# Patient Record
Sex: Female | Born: 1995 | Race: Black or African American | Hispanic: No | Marital: Single | Smoking: Never smoker
Health system: Southern US, Community
[De-identification: ages and names within clinical notes are randomized; demographics above are authoritative.]

## PROBLEM LIST (undated history)

## (undated) DIAGNOSIS — Z789 Other specified health status: Secondary | ICD-10-CM

---

## 2014-01-04 ENCOUNTER — Emergency Department (HOSPITAL_COMMUNITY): Payer: 59

## 2014-01-04 ENCOUNTER — Inpatient Hospital Stay (HOSPITAL_COMMUNITY)
Admission: EM | Admit: 2014-01-04 | Discharge: 2014-01-13 | DRG: 958 | Disposition: A | Discharge status: Home Health | Payer: 59 | Attending: General Surgery | Admitting: General Surgery

## 2014-01-04 ENCOUNTER — Inpatient Hospital Stay (HOSPITAL_COMMUNITY): Payer: 59

## 2014-01-04 ENCOUNTER — Encounter (HOSPITAL_COMMUNITY): Payer: Self-pay

## 2014-01-04 DIAGNOSIS — S322XXA Fracture of coccyx, initial encounter for closed fracture: Secondary | ICD-10-CM

## 2014-01-04 DIAGNOSIS — S329XXA Fracture of unspecified parts of lumbosacral spine and pelvis, initial encounter for closed fracture: Secondary | ICD-10-CM

## 2014-01-04 DIAGNOSIS — S32592A Other specified fracture of left pubis, initial encounter for closed fracture: Secondary | ICD-10-CM

## 2014-01-04 DIAGNOSIS — S060XAA Concussion with loss of consciousness status unknown, initial encounter: Secondary | ICD-10-CM | POA: Diagnosis present

## 2014-01-04 DIAGNOSIS — S36113A Laceration of liver, unspecified degree, initial encounter: Secondary | ICD-10-CM

## 2014-01-04 DIAGNOSIS — S3210XA Unspecified fracture of sacrum, initial encounter for closed fracture: Secondary | ICD-10-CM | POA: Diagnosis present

## 2014-01-04 DIAGNOSIS — S32509A Unspecified fracture of unspecified pubis, initial encounter for closed fracture: Secondary | ICD-10-CM | POA: Diagnosis present

## 2014-01-04 DIAGNOSIS — S82409A Unspecified fracture of shaft of unspecified fibula, initial encounter for closed fracture: Secondary | ICD-10-CM | POA: Diagnosis present

## 2014-01-04 DIAGNOSIS — S3720XA Unspecified injury of bladder, initial encounter: Secondary | ICD-10-CM | POA: Diagnosis present

## 2014-01-04 DIAGNOSIS — Z880 Allergy status to penicillin: Secondary | ICD-10-CM

## 2014-01-04 DIAGNOSIS — S82891A Other fracture of right lower leg, initial encounter for closed fracture: Secondary | ICD-10-CM

## 2014-01-04 DIAGNOSIS — S82899A Other fracture of unspecified lower leg, initial encounter for closed fracture: Secondary | ICD-10-CM

## 2014-01-04 DIAGNOSIS — D62 Acute posthemorrhagic anemia: Secondary | ICD-10-CM | POA: Diagnosis not present

## 2014-01-04 DIAGNOSIS — K560 Paralytic ileus: Secondary | ICD-10-CM | POA: Diagnosis not present

## 2014-01-04 DIAGNOSIS — S8253XA Displaced fracture of medial malleolus of unspecified tibia, initial encounter for closed fracture: Secondary | ICD-10-CM | POA: Diagnosis present

## 2014-01-04 DIAGNOSIS — N3289 Other specified disorders of bladder: Secondary | ICD-10-CM

## 2014-01-04 DIAGNOSIS — R000 Tachycardia, unspecified: Secondary | ICD-10-CM | POA: Diagnosis present

## 2014-01-04 DIAGNOSIS — S32591A Other specified fracture of right pubis, initial encounter for closed fracture: Secondary | ICD-10-CM | POA: Diagnosis present

## 2014-01-04 DIAGNOSIS — S3729XA Other injury of bladder, initial encounter: Secondary | ICD-10-CM | POA: Diagnosis present

## 2014-01-04 DIAGNOSIS — S8251XA Displaced fracture of medial malleolus of right tibia, initial encounter for closed fracture: Secondary | ICD-10-CM

## 2014-01-04 DIAGNOSIS — S32009A Unspecified fracture of unspecified lumbar vertebra, initial encounter for closed fracture: Secondary | ICD-10-CM | POA: Diagnosis present

## 2014-01-04 DIAGNOSIS — I959 Hypotension, unspecified: Secondary | ICD-10-CM | POA: Diagnosis present

## 2014-01-04 DIAGNOSIS — S82401A Unspecified fracture of shaft of right fibula, initial encounter for closed fracture: Secondary | ICD-10-CM

## 2014-01-04 DIAGNOSIS — S060X9A Concussion with loss of consciousness of unspecified duration, initial encounter: Secondary | ICD-10-CM

## 2014-01-04 DIAGNOSIS — S3730XA Unspecified injury of urethra, initial encounter: Secondary | ICD-10-CM | POA: Diagnosis present

## 2014-01-04 HISTORY — DX: Other specified health status: Z78.9

## 2014-01-04 LAB — COMPREHENSIVE METABOLIC PANEL
ALBUMIN: 3.7 g/dL (ref 3.5–5.2)
ALK PHOS: 54 U/L (ref 39–117)
ALT: 244 U/L — AB (ref 0–35)
AST: 287 U/L — ABNORMAL HIGH (ref 0–37)
BUN: 17 mg/dL (ref 6–23)
CALCIUM: 8.1 mg/dL — AB (ref 8.4–10.5)
CO2: 18 mEq/L — ABNORMAL LOW (ref 19–32)
Chloride: 103 mEq/L (ref 96–112)
Creatinine, Ser: 1.67 mg/dL — ABNORMAL HIGH (ref 0.50–1.10)
GFR calc Af Amer: 51 mL/min — ABNORMAL LOW (ref >90)
GFR calc non Af Amer: 44 mL/min — ABNORMAL LOW (ref >90)
Glucose, Bld: 137 mg/dL — ABNORMAL HIGH (ref 70–99)
POTASSIUM: 3.3 meq/L — AB (ref 3.7–5.3)
Sodium: 139 mEq/L (ref 137–147)
Total Bilirubin: 0.8 mg/dL (ref 0.3–1.2)
Total Protein: 6.8 g/dL (ref 6.0–8.3)

## 2014-01-04 LAB — CBC
HEMATOCRIT: 35.4 % — AB (ref 36.0–46.0)
HEMOGLOBIN: 11.7 g/dL — AB (ref 12.0–15.0)
MCH: 27.4 pg (ref 26.0–34.0)
MCHC: 33.1 g/dL (ref 30.0–36.0)
MCV: 82.9 fL (ref 78.0–100.0)
Platelets: 197 10*3/uL (ref 150–400)
RBC: 4.27 MIL/uL (ref 3.87–5.11)
RDW: 15.7 % — AB (ref 11.5–15.5)
WBC: 7.9 10*3/uL (ref 4.0–10.5)

## 2014-01-04 LAB — URINALYSIS, ROUTINE W REFLEX MICROSCOPIC
Glucose, UA: NEGATIVE mg/dL
Ketones, ur: 40 mg/dL — AB
NITRITE: POSITIVE — AB
PH: 5 (ref 5.0–8.0)
Protein, ur: >300 mg/dL — AB
Specific Gravity, Urine: 1.028 (ref 1.005–1.030)
Urobilinogen, UA: 1 mg/dL (ref 0.0–1.0)

## 2014-01-04 LAB — URINE MICROSCOPIC-ADD ON

## 2014-01-04 LAB — PROTIME-INR
INR: 1.07 (ref 0.00–1.49)
Prothrombin Time: 13.7 seconds (ref 11.6–15.2)

## 2014-01-04 LAB — ETHANOL

## 2014-01-04 LAB — POC URINE PREG, ED: Preg Test, Ur: NEGATIVE

## 2014-01-04 LAB — SAMPLE TO BLOOD BANK

## 2014-01-04 LAB — CDS SEROLOGY

## 2014-01-04 MED ORDER — FENTANYL CITRATE 0.05 MG/ML IJ SOLN
INTRAMUSCULAR | Status: AC | PRN
  Administered 2014-01-04 (×3): 50 ug via INTRAVENOUS

## 2014-01-04 MED ORDER — FENTANYL CITRATE 0.05 MG/ML IJ SOLN
50.0000 ug | Freq: Once | INTRAMUSCULAR | Status: AC
  Administered 2014-01-04: 50 ug via INTRAVENOUS

## 2014-01-04 MED ORDER — LIDOCAINE HCL 2 % EX GEL
Freq: Once | CUTANEOUS | Status: AC
Start: 2014-01-04 — End: 2014-01-04
  Administered 2014-01-04: 20 via URETHRAL
  Filled 2014-01-04: qty 20

## 2014-01-04 MED ORDER — SODIUM CHLORIDE 0.9 % IV BOLUS (SEPSIS)
1000.0000 mL | Freq: Once | INTRAVENOUS | Status: AC
  Administered 2014-01-04: 1000 mL via INTRAVENOUS

## 2014-01-04 MED ORDER — FENTANYL CITRATE 0.05 MG/ML IJ SOLN
INTRAMUSCULAR | Status: AC
  Filled 2014-01-04: qty 2

## 2014-01-04 MED ORDER — IOHEXOL 300 MG/ML  SOLN
100.0000 mL | Freq: Once | INTRAMUSCULAR | Status: AC | PRN
  Administered 2014-01-04: 100 mL via INTRAVENOUS

## 2014-01-04 NOTE — ED Notes (Signed)
DR Donell BeersByerly at bedside .
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
## 2014-01-04 NOTE — ED Notes (Signed)
Family updated as to patient's status.

## 2014-01-04 NOTE — ED Notes (Signed)
PT unable to void on bed pan.
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
## 2014-01-04 NOTE — ED Notes (Signed)
Family updated as to patient's status.DR Donell BeersByerly  At bed side.

## 2014-01-04 NOTE — Progress Notes (Signed)
Orthopedic Tech Progress Note Patient Details:  Judith PriestMeleisha [name] 12/01/1995 [id number]  Ortho Devices Type of Ortho Device: Ace wrap;Post (short leg) splint Ortho Device/Splint Location: RLE Ortho Device/Splint Interventions: Ordered;Application   Jennye MoccasinAnthony Craig Selia Wareing 01/04/2014, 9:23 PM

## 2014-01-04 NOTE — ED Notes (Signed)
Ortho Tech at Bedside to apply Splint to RLE
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
## 2014-01-04 NOTE — ED Notes (Signed)
Assisted Magda PaganiniAudrey, RN with In and Out Cath on pt; Magda PaganiniAudrey, RN was able to obtain the urine sample; pt tolerated well
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
## 2014-01-04 NOTE — ED Notes (Signed)
Ortho tech paged for treatment.

## 2014-01-04 NOTE — ED Notes (Signed)
Family at beside. Family given emotional support. 
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
## 2014-01-05 ENCOUNTER — Inpatient Hospital Stay (HOSPITAL_COMMUNITY): Payer: 59 | Admitting: Anesthesiology

## 2014-01-05 ENCOUNTER — Inpatient Hospital Stay (HOSPITAL_COMMUNITY): Payer: 59

## 2014-01-05 ENCOUNTER — Encounter (HOSPITAL_COMMUNITY): Payer: 59 | Admitting: Anesthesiology

## 2014-01-05 ENCOUNTER — Encounter (HOSPITAL_COMMUNITY): Payer: Self-pay | Admitting: Anesthesiology

## 2014-01-05 ENCOUNTER — Encounter (HOSPITAL_COMMUNITY): Admission: EM | Disposition: A | Discharge status: Home Health | Payer: Self-pay | Source: Home / Self Care

## 2014-01-05 DIAGNOSIS — S3720XA Unspecified injury of bladder, initial encounter: Secondary | ICD-10-CM

## 2014-01-05 DIAGNOSIS — S3730XA Unspecified injury of urethra, initial encounter: Secondary | ICD-10-CM

## 2014-01-05 HISTORY — PX: BLADDER NECK RECONSTRUCTION: SHX1239

## 2014-01-05 HISTORY — PX: LAPAROTOMY: SHX154

## 2014-01-05 LAB — CBC
HCT: 30.7 % — ABNORMAL LOW (ref 36.0–46.0)
HCT: 26.4 % — ABNORMAL LOW (ref 36.0–46.0)
HCT: 33.3 % — ABNORMAL LOW (ref 36.0–46.0)
HCT: 31 % — ABNORMAL LOW (ref 36.0–46.0)
HEMOGLOBIN: 11.4 g/dL — AB (ref 12.0–15.0)
Hemoglobin: 10 g/dL — ABNORMAL LOW (ref 12.0–15.0)
Hemoglobin: 8.8 g/dL — ABNORMAL LOW (ref 12.0–15.0)
Hemoglobin: 10.8 g/dL — ABNORMAL LOW (ref 12.0–15.0)
MCH: 27.5 pg (ref 26.0–34.0)
MCH: 27.6 pg (ref 26.0–34.0)
MCH: 29 pg (ref 26.0–34.0)
MCH: 29 pg (ref 26.0–34.0)
MCHC: 32.6 g/dL (ref 30.0–36.0)
MCHC: 33.3 g/dL (ref 30.0–36.0)
MCHC: 34.2 g/dL (ref 30.0–36.0)
MCHC: 34.8 g/dL (ref 30.0–36.0)
MCV: 84.3 fL (ref 78.0–100.0)
MCV: 82.8 fL (ref 78.0–100.0)
MCV: 84.7 fL (ref 78.0–100.0)
MCV: 83.3 fL (ref 78.0–100.0)
Platelets: 168 10*3/uL (ref 150–400)
Platelets: 161 10*3/uL (ref 150–400)
Platelets: 116 10*3/uL — ABNORMAL LOW (ref 150–400)
Platelets: 119 10*3/uL — ABNORMAL LOW (ref 150–400)
RBC: 3.64 MIL/uL — ABNORMAL LOW (ref 3.87–5.11)
RBC: 3.19 MIL/uL — ABNORMAL LOW (ref 3.87–5.11)
RBC: 3.93 MIL/uL (ref 3.87–5.11)
RBC: 3.72 MIL/uL — ABNORMAL LOW (ref 3.87–5.11)
RDW: 16.1 % — ABNORMAL HIGH (ref 11.5–15.5)
RDW: 15.8 % — ABNORMAL HIGH (ref 11.5–15.5)
RDW: 14.7 % (ref 11.5–15.5)
RDW: 14.8 % (ref 11.5–15.5)
WBC: 12.5 10*3/uL — ABNORMAL HIGH (ref 4.0–10.5)
WBC: 16 10*3/uL — ABNORMAL HIGH (ref 4.0–10.5)
WBC: 17.6 10*3/uL — AB (ref 4.0–10.5)
WBC: 18.7 10*3/uL — ABNORMAL HIGH (ref 4.0–10.5)

## 2014-01-05 LAB — COMPREHENSIVE METABOLIC PANEL WITH GFR
ALT: 172 U/L — ABNORMAL HIGH (ref 0–35)
AST: 123 U/L — ABNORMAL HIGH (ref 0–37)
Albumin: 3.2 g/dL — ABNORMAL LOW (ref 3.5–5.2)
Alkaline Phosphatase: 32 U/L — ABNORMAL LOW (ref 39–117)
BUN: 13 mg/dL (ref 6–23)
CO2: 19 meq/L (ref 19–32)
Calcium: 7.5 mg/dL — ABNORMAL LOW (ref 8.4–10.5)
Chloride: 102 meq/L (ref 96–112)
Creatinine, Ser: 0.94 mg/dL (ref 0.50–1.10)
GFR calc Af Amer: >90 mL/min
GFR calc non Af Amer: 87 mL/min — ABNORMAL LOW
Glucose, Bld: 196 mg/dL — ABNORMAL HIGH (ref 70–99)
Potassium: 4.3 meq/L (ref 3.7–5.3)
Sodium: 136 meq/L — ABNORMAL LOW (ref 137–147)
Total Bilirubin: 1 mg/dL (ref 0.3–1.2)
Total Protein: 5.9 g/dL — ABNORMAL LOW (ref 6.0–8.3)

## 2014-01-05 LAB — PROTIME-INR
INR: 1.2 (ref 0.00–1.49)
Prothrombin Time: 14.9 seconds (ref 11.6–15.2)

## 2014-01-05 LAB — ABO/RH: ABO/RH(D): O POS

## 2014-01-05 LAB — PREPARE RBC (CROSSMATCH)

## 2014-01-05 LAB — MRSA PCR SCREENING: MRSA by PCR: NEGATIVE

## 2014-01-05 LAB — APTT: aPTT: 30 s (ref 24–37)

## 2014-01-05 SURGERY — LAPAROTOMY, EXPLORATORY
Anesthesia: General | Site: Bladder

## 2014-01-05 MED ORDER — CHLORHEXIDINE GLUCONATE 4 % EX LIQD
60.0000 mL | Freq: Once | CUTANEOUS | Status: DC
  Filled 2014-01-05: qty 60

## 2014-01-05 MED ORDER — CLINDAMYCIN PHOSPHATE 900 MG/50ML IV SOLN
900.0000 mg | INTRAVENOUS | Status: DC
  Filled 2014-01-05: qty 50

## 2014-01-05 MED ORDER — ROCURONIUM BROMIDE 100 MG/10ML IV SOLN
INTRAVENOUS | Status: DC | PRN
  Administered 2014-01-05: 10 mg via INTRAVENOUS
  Administered 2014-01-05: 30 mg via INTRAVENOUS

## 2014-01-05 MED ORDER — ACETAMINOPHEN 325 MG PO TABS
650.0000 mg | ORAL_TABLET | ORAL | Status: DC | PRN
Start: 2014-01-05 — End: 2014-01-08
  Administered 2014-01-06: 650 mg via ORAL
  Administered 2014-01-07: 325 mg via ORAL
  Administered 2014-01-07: 650 mg via ORAL
  Filled 2014-01-05 (×3): qty 2

## 2014-01-05 MED ORDER — PHENYLEPHRINE 40 MCG/ML (10ML) SYRINGE FOR IV PUSH (FOR BLOOD PRESSURE SUPPORT)
PREFILLED_SYRINGE | INTRAVENOUS | Status: AC
  Filled 2014-01-05: qty 10

## 2014-01-05 MED ORDER — NEOSTIGMINE METHYLSULFATE 1 MG/ML IJ SOLN
INTRAMUSCULAR | Status: DC | PRN
  Administered 2014-01-05: 3 mg via INTRAVENOUS

## 2014-01-05 MED ORDER — KCL IN DEXTROSE-NACL 20-5-0.45 MEQ/L-%-% IV SOLN
INTRAVENOUS | Status: DC
  Administered 2014-01-05 – 2014-01-06 (×4): via INTRAVENOUS
  Filled 2014-01-05 (×6): qty 1000

## 2014-01-05 MED ORDER — MORPHINE SULFATE 2 MG/ML IJ SOLN
1.0000 mg | INTRAMUSCULAR | Status: DC | PRN
  Administered 2014-01-05 (×3): 2 mg via INTRAVENOUS
  Filled 2014-01-05 (×3): qty 1

## 2014-01-05 MED ORDER — ACETAMINOPHEN 500 MG PO TABS: 1000.0000 mg | ORAL_TABLET | Freq: Once | ORAL | Status: DC

## 2014-01-05 MED ORDER — DEXTROSE-NACL 5-0.45 % IV SOLN: 100.0000 mL/h | INTRAVENOUS | Status: DC

## 2014-01-05 MED ORDER — SODIUM CHLORIDE 0.9 % IV SOLN
INTRAVENOUS | Status: DC | PRN
  Administered 2014-01-05: 14:00:00 via INTRAVENOUS

## 2014-01-05 MED ORDER — PHENYLEPHRINE HCL 10 MG/ML IJ SOLN
INTRAMUSCULAR | Status: DC | PRN
  Administered 2014-01-05: 40 ug via INTRAVENOUS

## 2014-01-05 MED ORDER — OXYCODONE HCL 5 MG PO TABS: 5.0000 mg | ORAL_TABLET | Freq: Once | ORAL | Status: DC | PRN

## 2014-01-05 MED ORDER — GLYCOPYRROLATE 0.2 MG/ML IJ SOLN
INTRAMUSCULAR | Status: AC
  Filled 2014-01-05: qty 2

## 2014-01-05 MED ORDER — ROCURONIUM BROMIDE 50 MG/5ML IV SOLN
INTRAVENOUS | Status: AC
  Filled 2014-01-05: qty 1

## 2014-01-05 MED ORDER — SUCCINYLCHOLINE CHLORIDE 20 MG/ML IJ SOLN
INTRAMUSCULAR | Status: DC | PRN
  Administered 2014-01-05: 80 mg via INTRAVENOUS

## 2014-01-05 MED ORDER — NEOSTIGMINE METHYLSULFATE 1 MG/ML IJ SOLN
INTRAMUSCULAR | Status: AC
  Filled 2014-01-05: qty 10

## 2014-01-05 MED ORDER — HYDROMORPHONE HCL PF 1 MG/ML IJ SOLN
INTRAMUSCULAR | Status: AC
  Filled 2014-01-05: qty 1

## 2014-01-05 MED ORDER — LIDOCAINE HCL (CARDIAC) 20 MG/ML IV SOLN
INTRAVENOUS | Status: AC
  Filled 2014-01-05: qty 5

## 2014-01-05 MED ORDER — CIPROFLOXACIN IN D5W 400 MG/200ML IV SOLN
400.0000 mg | Freq: Once | INTRAVENOUS | Status: AC
  Administered 2014-01-05: 400 mg via INTRAVENOUS
  Filled 2014-01-05 (×2): qty 200

## 2014-01-05 MED ORDER — ONDANSETRON HCL 4 MG/2ML IJ SOLN
INTRAMUSCULAR | Status: AC
  Filled 2014-01-05: qty 2

## 2014-01-05 MED ORDER — ONDANSETRON HCL 4 MG/2ML IJ SOLN
INTRAMUSCULAR | Status: DC | PRN
  Administered 2014-01-05: 4 mg via INTRAVENOUS

## 2014-01-05 MED ORDER — FENTANYL CITRATE 0.05 MG/ML IJ SOLN
INTRAMUSCULAR | Status: DC | PRN
  Administered 2014-01-05 (×3): 50 ug via INTRAVENOUS

## 2014-01-05 MED ORDER — LACTATED RINGERS IV SOLN
INTRAVENOUS | Status: DC
  Administered 2014-01-05: 12:00:00 via INTRAVENOUS

## 2014-01-05 MED ORDER — HYDROMORPHONE HCL PF 1 MG/ML IJ SOLN
0.2500 mg | INTRAMUSCULAR | Status: DC | PRN
  Administered 2014-01-05 (×2): 0.5 mg via INTRAVENOUS

## 2014-01-05 MED ORDER — FENTANYL CITRATE 0.05 MG/ML IJ SOLN
INTRAMUSCULAR | Status: AC
  Filled 2014-01-05: qty 5

## 2014-01-05 MED ORDER — PROPOFOL 10 MG/ML IV BOLUS
INTRAVENOUS | Status: AC
  Filled 2014-01-05: qty 20

## 2014-01-05 MED ORDER — LIDOCAINE HCL (CARDIAC) 20 MG/ML IV SOLN
INTRAVENOUS | Status: DC | PRN
  Administered 2014-01-05: 60 mg via INTRAVENOUS

## 2014-01-05 MED ORDER — 0.9 % SODIUM CHLORIDE (POUR BTL) OPTIME
TOPICAL | Status: DC | PRN
  Administered 2014-01-05 (×2): 1000 mL

## 2014-01-05 MED ORDER — METOCLOPRAMIDE HCL 5 MG/ML IJ SOLN: 10.0000 mg | Freq: Once | INTRAMUSCULAR | Status: DC | PRN

## 2014-01-05 MED ORDER — OXYCODONE HCL 5 MG PO TABS
5.0000 mg | ORAL_TABLET | ORAL | Status: DC | PRN
  Administered 2014-01-05 – 2014-01-06 (×4): 10 mg via ORAL
  Filled 2014-01-05 (×4): qty 2

## 2014-01-05 MED ORDER — ONDANSETRON HCL 4 MG/2ML IJ SOLN: 4.0000 mg | Freq: Four times a day (QID) | INTRAMUSCULAR | Status: DC | PRN

## 2014-01-05 MED ORDER — GLYCOPYRROLATE 0.2 MG/ML IJ SOLN
INTRAMUSCULAR | Status: DC | PRN
  Administered 2014-01-05: 0.4 mg via INTRAVENOUS

## 2014-01-05 MED ORDER — ONDANSETRON HCL 4 MG PO TABS: 4.0000 mg | ORAL_TABLET | Freq: Four times a day (QID) | ORAL | Status: DC | PRN

## 2014-01-05 MED ORDER — OXYCODONE HCL 5 MG/5ML PO SOLN: 5.0000 mg | Freq: Once | ORAL | Status: DC | PRN

## 2014-01-05 MED ORDER — PROPOFOL 10 MG/ML IV BOLUS
INTRAVENOUS | Status: DC | PRN
  Administered 2014-01-05: 160 mg via INTRAVENOUS

## 2014-01-05 MED ORDER — LACTATED RINGERS IV SOLN
INTRAVENOUS | Status: DC | PRN
  Administered 2014-01-05: 12:00:00 via INTRAVENOUS

## 2014-01-05 MED ORDER — MIDAZOLAM HCL 2 MG/2ML IJ SOLN
INTRAMUSCULAR | Status: AC
  Filled 2014-01-05: qty 2

## 2014-01-05 MED ORDER — DIPHENHYDRAMINE HCL 50 MG/ML IJ SOLN: 12.5000 mg | Freq: Four times a day (QID) | INTRAMUSCULAR | Status: DC | PRN

## 2014-01-05 MED ORDER — MORPHINE SULFATE 2 MG/ML IJ SOLN
2.0000 mg | INTRAMUSCULAR | Status: DC | PRN
Start: 2014-01-05 — End: 2014-01-06

## 2014-01-05 SURGICAL SUPPLY — 51 items
BLADE SURG ROTATE 9660 (MISCELLANEOUS) ×4 IMPLANT
CANISTER SUCTION 2500CC (MISCELLANEOUS) ×4 IMPLANT
CATH FOLEY 2WAY SLVR  5CC 18FR (CATHETERS) ×2
CATH FOLEY 2WAY SLVR 5CC 18FR (CATHETERS) ×2 IMPLANT
CHLORAPREP W/TINT 26ML (MISCELLANEOUS) ×4 IMPLANT
COVER MAYO STAND STRL (DRAPES) ×4 IMPLANT
COVER SURGICAL LIGHT HANDLE (MISCELLANEOUS) ×4 IMPLANT
DRAPE LAPAROSCOPIC ABDOMINAL (DRAPES) ×4 IMPLANT
DRAPE PROXIMA HALF (DRAPES) ×8 IMPLANT
DRAPE UTILITY 15X26 W/TAPE STR (DRAPE) ×8 IMPLANT
DRAPE WARM FLUID 44X44 (DRAPE) ×4 IMPLANT
DRSG OPSITE POSTOP 4X10 (GAUZE/BANDAGES/DRESSINGS) IMPLANT
DRSG OPSITE POSTOP 4X8 (GAUZE/BANDAGES/DRESSINGS) IMPLANT
ELECT BLADE 6.5 EXT (BLADE) ×4 IMPLANT
ELECT CAUTERY BLADE 6.4 (BLADE) ×4 IMPLANT
ELECT REM PT RETURN 9FT ADLT (ELECTROSURGICAL) ×4
ELECTRODE REM PT RTRN 9FT ADLT (ELECTROSURGICAL) ×2 IMPLANT
GLOVE BIO SURGEON STRL SZ8 (GLOVE) ×8 IMPLANT
GLOVE BIOGEL PI IND STRL 8 (GLOVE) ×4 IMPLANT
GLOVE BIOGEL PI INDICATOR 8 (GLOVE) ×4
GOWN STRL REUS W/ TWL LRG LVL3 (GOWN DISPOSABLE) ×4 IMPLANT
GOWN STRL REUS W/ TWL XL LVL3 (GOWN DISPOSABLE) ×4 IMPLANT
GOWN STRL REUS W/TWL LRG LVL3 (GOWN DISPOSABLE) ×4
GOWN STRL REUS W/TWL XL LVL3 (GOWN DISPOSABLE) ×4
KIT BASIN OR (CUSTOM PROCEDURE TRAY) ×4 IMPLANT
KIT ROOM TURNOVER OR (KITS) ×4 IMPLANT
LIGASURE IMPACT 36 18CM CVD LR (INSTRUMENTS) IMPLANT
NS IRRIG 1000ML POUR BTL (IV SOLUTION) ×12 IMPLANT
PACK GENERAL/GYN (CUSTOM PROCEDURE TRAY) ×4 IMPLANT
PAD ARMBOARD 7.5X6 YLW CONV (MISCELLANEOUS) ×4 IMPLANT
PENCIL BUTTON HOLSTER BLD 10FT (ELECTRODE) IMPLANT
SPECIMEN JAR LARGE (MISCELLANEOUS) IMPLANT
SPONGE GAUZE 4X4 12PLY (GAUZE/BANDAGES/DRESSINGS) ×4 IMPLANT
SPONGE LAP 18X18 X RAY DECT (DISPOSABLE) ×4 IMPLANT
STAPLER VISISTAT 35W (STAPLE) ×4 IMPLANT
SUCTION POOLE TIP (SUCTIONS) ×4 IMPLANT
SUT PDS AB 1 TP1 96 (SUTURE) ×8 IMPLANT
SUT SILK 2 0 SH CR/8 (SUTURE) ×4 IMPLANT
SUT SILK 2 0 TIES 10X30 (SUTURE) ×4 IMPLANT
SUT SILK 3 0 SH CR/8 (SUTURE) ×4 IMPLANT
SUT SILK 3 0 TIES 10X30 (SUTURE) ×4 IMPLANT
SUT VIC AB 2-0 FS1 27 (SUTURE) ×4 IMPLANT
SUT VIC AB 2-0 SH 27 (SUTURE) ×4
SUT VIC AB 2-0 SH 27XBRD (SUTURE) ×4 IMPLANT
SYRINGE IRR TOOMEY STRL 70CC (SYRINGE) ×4 IMPLANT
TAPE CLOTH SURG 4X10 WHT LF (GAUZE/BANDAGES/DRESSINGS) ×4 IMPLANT
TOWEL OR 17X26 10 PK STRL BLUE (TOWEL DISPOSABLE) ×4 IMPLANT
TRAY FOLEY CATH 16FRSI W/METER (SET/KITS/TRAYS/PACK) ×4 IMPLANT
TUBE CONNECTING 12'X1/4 (SUCTIONS)
TUBE CONNECTING 12X1/4 (SUCTIONS) IMPLANT
YANKAUER SUCT BULB TIP NO VENT (SUCTIONS) ×4 IMPLANT

## 2014-01-05 NOTE — Progress Notes (Signed)
Patient ID: Judith [name], female   DOB: 1996/08/30, 19 y.o.   MRN: [id number] I met with her parents and uncle at the bedside and updated them on the plan of care.  Georganna Skeans, MD, MPH, FACS Trauma: [phone] General Surgery: [phone]

## 2014-01-05 NOTE — Progress Notes (Signed)
This was a follow up referral visit  from on call night Chaplain who had visited earlier with patient and family and thought that an additional visit would be helpful to patient and mother.  Pt. Is sleeping. Patient's mother expressed that she just wants her daughter to get better so that she can take her home.  Mother said she was waiting on doctor to come in and discuss what's next regarding patient ankle.  I offered emotional support, empathic listening and ministry of presence. Mother was receptive to visit and will have nurse to page us if further services are desired. Will follow as needed.  01/05/14 1000  Clinical Encounter Type  Visited With Patient and family together;Health care provider  Visit Type Initial;Spiritual support  Referral From Chaplain  Spiritual Encounters  Spiritual Needs Emotional  Stress Factors  Patient Stress Factors None identified  Family Stress Factors Health changes  Mathis Daday L Jaicee Michelotti, [place], pager [phone]

## 2014-01-05 NOTE — Progress Notes (Signed)
Patient ID: Judith [name], female   DOB: 09/17/1996, 18 y.o.   MRN: [id number]  Pt more tachycardic and hypotensive. UOP normal and BUN/ Cr normal, but I feel pt needs exploratory laparatomy and closure of bladder injury. I discussed with Dr. Janee Mornhompson who agrees and saw pt this AM. He doesn't feel the liver lac would contribute much. He will set pt up for ex lap and I will be over to close bladder.

## 2014-01-05 NOTE — Anesthesia Procedure Notes (Signed)
Procedure Name: Intubation Date/Time: 01/05/2014 1:03 PM Performed by: Sharlene DoryWALKER, Jowell Bossi E Pre-anesthesia Checklist: Patient identified, Emergency Drugs available, Suction available, Patient being monitored and Timeout performed Patient Re-evaluated:Patient Re-evaluated prior to inductionOxygen Delivery Method: Circle system utilized Preoxygenation: Pre-oxygenation with 100% oxygen Intubation Type: IV induction, Cricoid Pressure applied and Rapid sequence Ventilation: Mask ventilation without difficulty Laryngoscope Size: Mac and 3 Grade View: Grade I Tube type: Oral Tube size: 7.0 mm Number of attempts: 1 Airway Equipment and Method: Stylet Placement Confirmation: ETT inserted through vocal cords under direct vision,  positive ETCO2 and breath sounds checked- equal and bilateral Secured at: 21 cm Tube secured with: Tape Dental Injury: Teeth and Oropharynx as per pre-operative assessment

## 2014-01-05 NOTE — Progress Notes (Signed)
Pt seen and examined. Continues to be tachycardic and tachypnic which could be anemia, sepsis, occult bowel injury etc. Discussed with mother, Hermenia BersMichelle Kelly, the nature r/b/a to ex lap, bladder closure including continued supportive care with foley. All questions answered. She elects to proceed. Discussed post-op course and foley x 10 - 21 days.

## 2014-01-05 NOTE — Progress Notes (Signed)
Patient ID: Judith [name], female   DOB: 04/02/1996, 18 y.o.   MRN: [id number] Patient remains tachycardic with hematuria. Hb has dropped. I D/W Dr. Mena GoesEskridge and he wants to repair her bladder. We will coordinate and do ex lap for exposure and we will evaluate the liver lac at that time. Procedure, risks, and benefits D/W patient and her mother. She agrees. Violeta GelinasBurke Laurens Matheny, MD, MPH, FACS Trauma: [phone] General Surgery: [phone]

## 2014-01-05 NOTE — Anesthesia Preprocedure Evaluation (Addendum)
Anesthesia Evaluation  Patient identified by MRN, date of birth, ID band Patient awake    Reviewed: Allergy & Precautions, H&P , NPO status , Patient's Chart, lab work & pertinent test results, reviewed documented beta blocker date and time   Airway Mallampati: II TM Distance: >3 FB Neck ROM: full    Dental   Pulmonary neg pulmonary ROS,  breath sounds clear to auscultation        Cardiovascular negative cardio ROS  Rhythm:regular     Neuro/Psych negative neurological ROS  negative psych ROS   GI/Hepatic negative GI ROS, Neg liver ROS,   Endo/Other  negative endocrine ROS  Renal/GU negative Renal ROS Bladder dysfunction      Musculoskeletal   Abdominal   Peds  Hematology  (+) Blood dyscrasia, anemia ,   Anesthesia Other Findings See surgeon's H&P   Reproductive/Obstetrics negative OB ROS                           Anesthesia Physical Anesthesia Plan  ASA: III and emergent  Anesthesia Plan: General   Post-op Pain Management:    Induction: Intravenous, Rapid sequence and Cricoid pressure planned  Airway Management Planned: Oral ETT  Additional Equipment:   Intra-op Plan:   Post-operative Plan: Extubation in OR  Informed Consent: I have reviewed the patients History and Physical, chart, labs and discussed the procedure including the risks, benefits and alternatives for the proposed anesthesia with the patient or authorized representative who has indicated his/her understanding and acceptance.   Dental Advisory Given  Plan Discussed with: CRNA and Surgeon  Anesthesia Plan Comments:         Anesthesia Quick Evaluation

## 2014-01-05 NOTE — Op Note (Signed)
Preoperative diagnosis: intraperitoneal bladder perforation Postoperative diagnosis: Intraperitoneal bladder perforation  Procedure:  bladder repair  Surgeon: Mena GoesEskridge, MD Assistant: Janee Mornhompson, MD Assistant: Leotis ShamesJeffery, [place]  Description of procedure: The patient was prepped and draped in the usual sterile fashion in lithotomy prepping the vagina into the field. I placed an 4918 French Foley catheter without difficulty. After the incision and initial exploratory laparotomy a Balfour retractor and bladder blade was used to expose the bladder.  I needed to extend the incision at the 3 cm inferior to get better bladder exposure. There was as expected about a quarter-sized hole in the dome of the bladder.  the uterus and ovaries and posterior area to the bladder appeared normal. The Foley catheter/balloon  was palpable in the bladder and in normal position. There was a significant amount of clot around the bladder perforation  intraperitoneally which was removed. The wound edges were irrigated and noted to be a healthy and viable. Based on her CT scans I felt the remainder of the bladder was well staged/evaluated and the cystotomy did not need to be widened. The bladder was closed in 2 layers with a running 2-0 Vicryl suture first incorporating the mucosa. A second layer consisting of the muscle and peritoneum. The bladder was then filled to 250 cc and noted to be watertight. The drainage was clear. The Foley was connected to gravity drainage. The patient was turned back over to Dr. Janee Mornhompson for completely exploratory laparotomy and closure. she was covered with Cipro.  Disposition:  continue Foley catheter for 2 weeks. Followup for cystogram in my office. Discharge with Foley catheter - instructions and followup placed on the chart. Will follow.

## 2014-01-05 NOTE — ED Notes (Signed)
PT given warm blanks and heat on in room.

## 2014-01-05 NOTE — Progress Notes (Signed)
Patient ID: Judith PriestMeleisha Wissinger, female   DOB: 02/11/1996, 18 y.o.   MRN: [id number] Pt has intraperitoneal bladder rupture on CT cysto.  Reviewed with radiology.  Discussed with Dr Mena GoesEskridge of urology who has agreed to evaluate the patient.  She is otherwise stable. Unable to clear C spine at this point since pt not cooperative.
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
## 2014-01-05 NOTE — Transfer of Care (Signed)
Immediate Anesthesia Transfer of Care Note  Patient: Judith [name]  Procedure(s) Performed: Procedure(s): EXPLORATORY LAPAROTOMY (N/A) BLADDER  REPAIR (N/A)  Patient Location: PACU  Anesthesia Type:General  Level of Consciousness: awake, alert  and oriented  Airway & Oxygen Therapy: Patient Spontanous Breathing and Patient connected to nasal cannula oxygen  Post-op Assessment: Report given to PACU RN, Post -op Vital signs reviewed and stable and Patient moving all extremities X 4  Post vital signs: Reviewed and stable  Complications: No apparent anesthesia complications
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
## 2014-01-06 ENCOUNTER — Encounter (HOSPITAL_COMMUNITY): Payer: Self-pay | Admitting: General Surgery

## 2014-01-06 DIAGNOSIS — S32591A Other specified fracture of right pubis, initial encounter for closed fracture: Secondary | ICD-10-CM | POA: Diagnosis present

## 2014-01-06 DIAGNOSIS — S32592A Other specified fracture of left pubis, initial encounter for closed fracture: Secondary | ICD-10-CM

## 2014-01-06 DIAGNOSIS — S82891A Other fracture of right lower leg, initial encounter for closed fracture: Secondary | ICD-10-CM | POA: Diagnosis present

## 2014-01-06 DIAGNOSIS — S3729XA Other injury of bladder, initial encounter: Secondary | ICD-10-CM | POA: Diagnosis present

## 2014-01-06 DIAGNOSIS — D62 Acute posthemorrhagic anemia: Secondary | ICD-10-CM

## 2014-01-06 LAB — TYPE AND SCREEN
ABO/RH(D): O POS
Antibody Screen: NEGATIVE
UNIT DIVISION: 0
Unit division: 0

## 2014-01-06 LAB — CBC
HCT: 28.9 % — ABNORMAL LOW (ref 36.0–46.0)
HCT: 27.9 % — ABNORMAL LOW (ref 36.0–46.0)
HEMOGLOBIN: 10.1 g/dL — AB (ref 12.0–15.0)
HEMOGLOBIN: 9.6 g/dL — AB (ref 12.0–15.0)
MCH: 29.1 pg (ref 26.0–34.0)
MCH: 28.7 pg (ref 26.0–34.0)
MCHC: 34.9 g/dL (ref 30.0–36.0)
MCHC: 34.4 g/dL (ref 30.0–36.0)
MCV: 83.5 fL (ref 78.0–100.0)
MCV: 83.3 fL (ref 78.0–100.0)
Platelets: 111 10*3/uL — ABNORMAL LOW (ref 150–400)
Platelets: 109 10*3/uL — ABNORMAL LOW (ref 150–400)
RBC: 3.47 MIL/uL — ABNORMAL LOW (ref 3.87–5.11)
RBC: 3.34 MIL/uL — AB (ref 3.87–5.11)
RDW: 15 % (ref 11.5–15.5)
RDW: 14.8 % (ref 11.5–15.5)
WBC: 16.5 10*3/uL — ABNORMAL HIGH (ref 4.0–10.5)
WBC: 14.4 10*3/uL — ABNORMAL HIGH (ref 4.0–10.5)

## 2014-01-06 LAB — COMPREHENSIVE METABOLIC PANEL
ALBUMIN: 2.7 g/dL — AB (ref 3.5–5.2)
ALT: 94 U/L — ABNORMAL HIGH (ref 0–35)
AST: 61 U/L — ABNORMAL HIGH (ref 0–37)
Alkaline Phosphatase: 34 U/L — ABNORMAL LOW (ref 39–117)
BILIRUBIN TOTAL: 1.5 mg/dL — AB (ref 0.3–1.2)
BUN: 6 mg/dL (ref 6–23)
CHLORIDE: 105 meq/L (ref 96–112)
CO2: 24 mEq/L (ref 19–32)
CREATININE: 0.75 mg/dL (ref 0.50–1.10)
Calcium: 7.6 mg/dL — ABNORMAL LOW (ref 8.4–10.5)
GLUCOSE: 133 mg/dL — AB (ref 70–99)
Potassium: 3.9 mEq/L (ref 3.7–5.3)
Sodium: 140 mEq/L (ref 137–147)
Total Protein: 5.4 g/dL — ABNORMAL LOW (ref 6.0–8.3)

## 2014-01-06 MED ORDER — ENOXAPARIN SODIUM 40 MG/0.4ML ~~LOC~~ SOLN
40.0000 mg | SUBCUTANEOUS | Status: DC
  Administered 2014-01-07 – 2014-01-13 (×6): 40 mg via SUBCUTANEOUS
  Filled 2014-01-06 (×8): qty 0.4

## 2014-01-06 MED ORDER — OXYCODONE HCL 5 MG PO TABS
5.0000 mg | ORAL_TABLET | ORAL | Status: DC | PRN
  Administered 2014-01-06 (×2): 10 mg via ORAL
  Administered 2014-01-07 – 2014-01-08 (×3): 5 mg via ORAL
  Filled 2014-01-06 (×2): qty 1
  Filled 2014-01-06 (×2): qty 2
  Filled 2014-01-06: qty 1

## 2014-01-06 MED ORDER — CLINDAMYCIN PHOSPHATE 900 MG/50ML IV SOLN
900.0000 mg | INTRAVENOUS | Status: DC
  Filled 2014-01-06: qty 50

## 2014-01-06 MED ORDER — POTASSIUM CHLORIDE IN NACL 20-0.45 MEQ/L-% IV SOLN
INTRAVENOUS | Status: DC
  Administered 2014-01-06 – 2014-01-09 (×5): via INTRAVENOUS
  Administered 2014-01-10: 50 mL/h via INTRAVENOUS
  Filled 2014-01-06 (×11): qty 1000

## 2014-01-06 MED ORDER — MORPHINE SULFATE 2 MG/ML IJ SOLN
2.0000 mg | INTRAMUSCULAR | Status: DC | PRN
  Administered 2014-01-06 – 2014-01-13 (×5): 2 mg via INTRAVENOUS
  Filled 2014-01-06 (×6): qty 1

## 2014-01-06 MED ORDER — DEXTROSE-NACL 5-0.45 % IV SOLN: INTRAVENOUS | Status: DC

## 2014-01-06 MED ORDER — WHITE PETROLATUM GEL
Status: AC
  Administered 2014-01-06: 12:00:00
  Filled 2014-01-06: qty 5

## 2014-01-06 MED ORDER — CHLORHEXIDINE GLUCONATE 4 % EX LIQD
60.0000 mL | Freq: Once | CUTANEOUS | Status: DC
  Filled 2014-01-06: qty 60

## 2014-01-06 MED ORDER — ACETAMINOPHEN 500 MG PO TABS
1000.0000 mg | ORAL_TABLET | Freq: Once | ORAL | Status: DC
  Filled 2014-01-06: qty 2

## 2014-01-06 NOTE — Progress Notes (Signed)
Patient ID: [name], female   DOB: 10/21/1995, 18 y.o.   MRN: [id number]  Patient complains of some abdominal pain, but overall she looks much more alert than yesterday and much better.  Filed Vitals:   01/06/14 0600  BP: 131/75  Pulse: 107  Temp:   Resp: 17    Intake/Output Summary (Last 24 hours) at 01/06/14 [id number] Last data filed at 01/06/14 0600  Gross per 24 hour  Intake 4738.75 ml  Output   5180 ml  Net -441.25 ml   Physical exam: She is lying supine in no acute distress Abdomen is mildly tender but does not appear to me as distended as yesterday. Urine is clear.   CBC    Component Value Date/Time   WBC 16.5* 01/06/2014 0025   RBC 3.47* 01/06/2014 0025   HGB 10.1* 01/06/2014 0025   HCT 28.9* 01/06/2014 0025   PLT 111* 01/06/2014 0025   MCV 83.3 01/06/2014 0025   MCH 29.1 01/06/2014 0025   MCHC 34.9 01/06/2014 0025   RDW 14.8 01/06/2014 0025    BMET    Component Value Date/Time   NA 136* 01/05/2014 0808   K 4.3 01/05/2014 0808   CL 102 01/05/2014 0808   CO2 19 01/05/2014 0808   GLUCOSE 196* 01/05/2014 0808   BUN 13 01/05/2014 0808   CREATININE 0.94 01/05/2014 0808   CALCIUM 7.5* 01/05/2014 0808   GFRNONAA 87* 01/05/2014 0808   GFRAA >90 01/05/2014 0808   A/P: Postop day 1 Exploratory laparotomy and closure of intraperitoneal bladder rupture-patient should follow-up with me in 2 weeks for cystogram and Foley removal.  I put discharge instructions and orders on chart. From a urologic point of view, her diet and activity can be advanced as usual.

## 2014-01-06 NOTE — Anesthesia Postprocedure Evaluation (Signed)
  Anesthesia Post-op Note  Patient: Judith [name]  Procedure(s) Performed: Procedure(s): EXPLORATORY LAPAROTOMY (N/A) BLADDER  REPAIR (N/A)  Patient Location: Stepdown  Anesthesia Type:General  Level of Consciousness: awake, alert  and oriented  Airway and Oxygen Therapy: Patient Spontanous Breathing and Patient connected to nasal cannula oxygen  Post-op Pain: moderate  Post-op Assessment: Post-op Vital signs reviewed, Patient's Cardiovascular Status Stable, Respiratory Function Stable, Patent Airway and No signs of Nausea or vomiting  Post-op Vital Signs: Reviewed and stable  Last Vitals:  Filed Vitals:   01/06/14 1200  BP:   Pulse: 102  Temp: 37.1 C  Resp: 20    Complications: No apparent anesthesia complications

## 2014-01-06 NOTE — Progress Notes (Signed)
PT Cancellation Note  Patient Details Name: Judith [name] MRN: [id number] DOB: 08/23/1996   Cancelled Treatment:    Reason Eval/Treat Not Completed: Other (comment). Pt asleep and difficulty to maintain eye opening to participate in PT at this time. PT to return as able 4/16.   Judith [name] Judith [name] 01/06/2014, 4:27 PM  Lewis ShockAshly Brad Lieurance, PT, DPT Pager #: [phone] Office #: [phone]

## 2014-01-06 NOTE — Progress Notes (Signed)
OT Cancellation Note  Patient Details Name: Judith PriestMeleisha Cousin MRN: [id number] DOB: 11/13/1995   Cancelled Treatment:    Reason Eval/Treat Not Completed: Pain limiting ability to participate Will see in am. [facility] LLCilary S Imaan Padgett Mohammedali Bedoy, OTR/L  [phone] 01/06/2014 01/06/2014, 4:52 PM
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
## 2014-01-07 ENCOUNTER — Encounter (HOSPITAL_COMMUNITY): Payer: Self-pay | Admitting: *Deleted

## 2014-01-07 LAB — CBC
HEMATOCRIT: 26.7 % — AB (ref 36.0–46.0)
Hemoglobin: 8.9 g/dL — ABNORMAL LOW (ref 12.0–15.0)
MCH: 28.3 pg (ref 26.0–34.0)
MCHC: 33.3 g/dL (ref 30.0–36.0)
MCV: 84.8 fL (ref 78.0–100.0)
Platelets: 114 10*3/uL — ABNORMAL LOW (ref 150–400)
RBC: 3.15 MIL/uL — AB (ref 3.87–5.11)
RDW: 15.2 % (ref 11.5–15.5)
WBC: 12.2 10*3/uL — ABNORMAL HIGH (ref 4.0–10.5)

## 2014-01-07 MED ORDER — TRAMADOL HCL 50 MG PO TABS: 50.0000 mg | ORAL_TABLET | Freq: Four times a day (QID) | ORAL | Status: DC | PRN

## 2014-01-07 MED ORDER — DEXTROSE-NACL 5-0.45 % IV SOLN
INTRAVENOUS | Status: DC
  Administered 2014-01-12: 05:00:00 via INTRAVENOUS

## 2014-01-07 MED ORDER — TIZANIDINE HCL 4 MG PO TABS
4.0000 mg | ORAL_TABLET | Freq: Three times a day (TID) | ORAL | Status: DC | PRN
  Administered 2014-01-09 – 2014-01-12 (×2): 4 mg via ORAL
  Filled 2014-01-07 (×3): qty 1

## 2014-01-07 NOTE — Progress Notes (Addendum)
Pt to TX to 5N-09, VSS, called report; family present & aware of TX.
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
## 2014-01-07 NOTE — Plan of Care (Signed)
Problem: Acute Rehab PT Goals(only PT should resolve) Goal: Pt Will Go Up/Down Stairs Pt and family to be able to demo safe stair negotiating with least resistive device for D/C home. Wheelchair vs RW pending safety.

## 2014-01-08 LAB — CBC
HEMATOCRIT: 23.9 % — AB (ref 36.0–46.0)
HEMOGLOBIN: 8.2 g/dL — AB (ref 12.0–15.0)
MCH: 29.5 pg (ref 26.0–34.0)
MCHC: 34.3 g/dL (ref 30.0–36.0)
MCV: 86 fL (ref 78.0–100.0)
Platelets: 138 10*3/uL — ABNORMAL LOW (ref 150–400)
RBC: 2.78 MIL/uL — ABNORMAL LOW (ref 3.87–5.11)
RDW: 14.8 % (ref 11.5–15.5)
WBC: 8.4 10*3/uL (ref 4.0–10.5)

## 2014-01-08 MED ORDER — DOCUSATE SODIUM 100 MG PO CAPS
100.0000 mg | ORAL_CAPSULE | Freq: Two times a day (BID) | ORAL | Status: DC
  Administered 2014-01-08 – 2014-01-10 (×6): 100 mg via ORAL
  Filled 2014-01-08 (×7): qty 1

## 2014-01-08 MED ORDER — NITROFURANTOIN MONOHYD MACRO 100 MG PO CAPS
100.0000 mg | ORAL_CAPSULE | Freq: Every day | ORAL | Status: DC
  Administered 2014-01-08 – 2014-01-12 (×5): 100 mg via ORAL
  Filled 2014-01-08 (×6): qty 1

## 2014-01-08 MED ORDER — TRAMADOL HCL 50 MG PO TABS
50.0000 mg | ORAL_TABLET | Freq: Four times a day (QID) | ORAL | Status: DC | PRN
  Administered 2014-01-08 (×2): 50 mg via ORAL
  Administered 2014-01-08 – 2014-01-13 (×12): 100 mg via ORAL
  Filled 2014-01-08 (×4): qty 2
  Filled 2014-01-08: qty 1
  Filled 2014-01-08: qty 2
  Filled 2014-01-08: qty 1
  Filled 2014-01-08 (×2): qty 2
  Filled 2014-01-08: qty 1
  Filled 2014-01-08 (×5): qty 2

## 2014-01-08 MED ORDER — POLYETHYLENE GLYCOL 3350 17 G PO PACK
17.0000 g | PACK | Freq: Every day | ORAL | Status: DC
  Administered 2014-01-08 – 2014-01-13 (×5): 17 g via ORAL
  Filled 2014-01-08 (×7): qty 1
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
## 2014-01-08 NOTE — Progress Notes (Signed)
Flat affect.  Doing okay.  Keep foley and advance diet.  This patient has been seen and I agree with the findings and treatment plan.  Marta LamasJames O. Gae BonWyatt, III, MD, FACS [phone] (pager) [phone] (direct pager) Trauma Surgeon

## 2014-01-08 NOTE — Progress Notes (Signed)
Patient ID: Jessica PriestMeleisha Mummert, female   DOB: 08/20/1996, 18 y.o.   MRN: [id number]  Patient is sleeping in a chair.  Her mother is with her.  Her mother reports the patient is getting occasional urgency.  This is not very bothersome.  Her urine is clear.  Intake/Output Summary (Last 24 hours) at 01/08/14 1307 Last data filed at 01/08/14 0600  Gross per 24 hour  Intake   1425 ml  Output   2380 ml  Net   -955 ml     BMET    Component Value Date/Time   NA 140 01/06/2014 0645   K 3.9 01/06/2014 0645   CL 105 01/06/2014 0645   CO2 24 01/06/2014 0645   GLUCOSE 133* 01/06/2014 0645   BUN 6 01/06/2014 0645   CREATININE 0.75 01/06/2014 0645   CALCIUM 7.6* 01/06/2014 0645   GFRNONAA >90 01/06/2014 0645   GFRAA >90 01/06/2014 0645    CBC    Component Value Date/Time   WBC 8.4 01/08/2014 0707   RBC 2.78* 01/08/2014 0707   HGB 8.2* 01/08/2014 0707   HCT 23.9* 01/08/2014 0707   PLT 138* 01/08/2014 0707   MCV 86.0 01/08/2014 0707   MCH 29.5 01/08/2014 0707   MCHC 34.3 01/08/2014 0707   RDW 14.8 01/08/2014 0707   Imp - Intraperitoneal bladder rupture status post repair - Plan - continue Foley catheter.  I gave the patient's mother my contact information for follow-up. Cystogram in 2 weeks. I'm going to start her on a daily nitrofurantoin to limit colonization/UTI risk.
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
## 2014-01-09 LAB — CBC
HCT: 24.6 % — ABNORMAL LOW (ref 36.0–46.0)
Hemoglobin: 8.2 g/dL — ABNORMAL LOW (ref 12.0–15.0)
MCH: 28.5 pg (ref 26.0–34.0)
MCHC: 33.3 g/dL (ref 30.0–36.0)
MCV: 85.4 fL (ref 78.0–100.0)
Platelets: 180 10*3/uL (ref 150–400)
RBC: 2.88 MIL/uL — ABNORMAL LOW (ref 3.87–5.11)
RDW: 14.3 % (ref 11.5–15.5)
WBC: 6.7 10*3/uL (ref 4.0–10.5)
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
## 2014-01-09 NOTE — Progress Notes (Signed)
Patient's diet changed to regular. Patient has no appetite. Patient does not want to eat. Encouraged patient to eat. Patient has not had a BM since 4/13. Miralax given as scheduled,
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
## 2014-01-10 LAB — CBC
HCT: 28.8 % — ABNORMAL LOW (ref 36.0–46.0)
Hemoglobin: 9.6 g/dL — ABNORMAL LOW (ref 12.0–15.0)
MCH: 28.4 pg (ref 26.0–34.0)
MCHC: 33.3 g/dL (ref 30.0–36.0)
MCV: 85.2 fL (ref 78.0–100.0)
Platelets: 224 10*3/uL (ref 150–400)
RBC: 3.38 MIL/uL — ABNORMAL LOW (ref 3.87–5.11)
RDW: 14.1 % (ref 11.5–15.5)
WBC: 7.6 10*3/uL (ref 4.0–10.5)

## 2014-01-10 MED ORDER — ENSURE COMPLETE PO LIQD
237.0000 mL | Freq: Two times a day (BID) | ORAL | Status: DC
  Administered 2014-01-10 – 2014-01-13 (×4): 237 mL via ORAL
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
## 2014-01-11 MED ORDER — MAGNESIUM CITRATE PO SOLN
1.0000 | Freq: Once | ORAL | Status: AC
  Administered 2014-01-11: 1 via ORAL
  Filled 2014-01-11: qty 296

## 2014-01-11 MED ORDER — DOCUSATE SODIUM 100 MG PO CAPS
200.0000 mg | ORAL_CAPSULE | Freq: Two times a day (BID) | ORAL | Status: DC
  Administered 2014-01-11 – 2014-01-13 (×4): 200 mg via ORAL
  Filled 2014-01-11 (×5): qty 2
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
## 2014-01-11 NOTE — Progress Notes (Signed)
Better spirits. I encouraged ambulation. Try mag citrate. I spoke with her mother. ORIF R ankle by ortho tomorrow. Patient examined and I agree with the assessment and plan  Violeta GelinasBurke Melva Faux, MD, MPH, FACS Trauma: [phone] General Surgery: [phone]  01/11/2014 9:32 AM
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
## 2014-01-12 ENCOUNTER — Encounter (HOSPITAL_COMMUNITY): Payer: Self-pay | Admitting: Anesthesiology

## 2014-01-12 ENCOUNTER — Inpatient Hospital Stay (HOSPITAL_COMMUNITY): Payer: 59

## 2014-01-12 ENCOUNTER — Encounter (HOSPITAL_COMMUNITY): Admission: EM | Disposition: A | Discharge status: Home Health | Payer: Self-pay | Source: Home / Self Care

## 2014-01-12 ENCOUNTER — Encounter (HOSPITAL_COMMUNITY): Payer: 59 | Admitting: Anesthesiology

## 2014-01-12 ENCOUNTER — Inpatient Hospital Stay (HOSPITAL_COMMUNITY): Payer: 59 | Admitting: Anesthesiology

## 2014-01-12 HISTORY — PX: ORIF ANKLE FRACTURE: SHX5408

## 2014-01-12 LAB — SURGICAL PCR SCREEN
MRSA, PCR: NEGATIVE
STAPHYLOCOCCUS AUREUS: NEGATIVE

## 2014-01-12 SURGERY — OPEN REDUCTION INTERNAL FIXATION (ORIF) ANKLE FRACTURE
Anesthesia: General | Laterality: Right

## 2014-01-12 MED ORDER — ROCURONIUM BROMIDE 50 MG/5ML IV SOLN
INTRAVENOUS | Status: AC
  Filled 2014-01-12: qty 1

## 2014-01-12 MED ORDER — PROMETHAZINE HCL 25 MG/ML IJ SOLN: 6.2500 mg | INTRAMUSCULAR | Status: DC | PRN

## 2014-01-12 MED ORDER — CLINDAMYCIN PHOSPHATE 600 MG/50ML IV SOLN
600.0000 mg | Freq: Four times a day (QID) | INTRAVENOUS | Status: AC
  Administered 2014-01-12 – 2014-01-13 (×3): 600 mg via INTRAVENOUS
  Filled 2014-01-12 (×3): qty 50

## 2014-01-12 MED ORDER — ARTIFICIAL TEARS OP OINT
TOPICAL_OINTMENT | OPHTHALMIC | Status: AC
  Filled 2014-01-12: qty 3.5

## 2014-01-12 MED ORDER — SUCCINYLCHOLINE CHLORIDE 20 MG/ML IJ SOLN
INTRAMUSCULAR | Status: AC
  Filled 2014-01-12: qty 1

## 2014-01-12 MED ORDER — ONDANSETRON HCL 4 MG/2ML IJ SOLN
INTRAMUSCULAR | Status: AC
  Filled 2014-01-12: qty 2

## 2014-01-12 MED ORDER — MIDAZOLAM HCL 2 MG/2ML IJ SOLN
INTRAMUSCULAR | Status: AC
  Filled 2014-01-12: qty 2

## 2014-01-12 MED ORDER — HYDROMORPHONE HCL PF 1 MG/ML IJ SOLN
0.2500 mg | INTRAMUSCULAR | Status: DC | PRN
  Administered 2014-01-12 (×2): 0.5 mg via INTRAVENOUS

## 2014-01-12 MED ORDER — OXYCODONE HCL 5 MG PO TABS: 5.0000 mg | ORAL_TABLET | Freq: Once | ORAL | Status: DC | PRN

## 2014-01-12 MED ORDER — BUPIVACAINE-EPINEPHRINE PF 0.5-1:200000 % IJ SOLN
INTRAMUSCULAR | Status: DC | PRN
  Administered 2014-01-12: 40 mL

## 2014-01-12 MED ORDER — HYDROMORPHONE HCL PF 1 MG/ML IJ SOLN
INTRAMUSCULAR | Status: AC
  Filled 2014-01-12: qty 1

## 2014-01-12 MED ORDER — ONDANSETRON HCL 4 MG/2ML IJ SOLN
INTRAMUSCULAR | Status: DC | PRN
  Administered 2014-01-12: 4 mg via INTRAVENOUS

## 2014-01-12 MED ORDER — LACTATED RINGERS IV SOLN
INTRAVENOUS | Status: DC
  Administered 2014-01-12: 10:00:00 via INTRAVENOUS
  Administered 2014-01-13: 20 mL/h via INTRAVENOUS

## 2014-01-12 MED ORDER — CLINDAMYCIN PHOSPHATE 900 MG/50ML IV SOLN
900.0000 mg | INTRAVENOUS | Status: AC
  Administered 2014-01-12: 900 mg via INTRAVENOUS
  Filled 2014-01-12: qty 50

## 2014-01-12 MED ORDER — PROPOFOL 10 MG/ML IV BOLUS
INTRAVENOUS | Status: AC
  Filled 2014-01-12: qty 20

## 2014-01-12 MED ORDER — OXYCODONE HCL 5 MG/5ML PO SOLN: 5.0000 mg | Freq: Once | ORAL | Status: DC | PRN

## 2014-01-12 MED ORDER — ARTIFICIAL TEARS OP OINT
TOPICAL_OINTMENT | OPHTHALMIC | Status: DC | PRN
  Administered 2014-01-12: 1 via OPHTHALMIC

## 2014-01-12 MED ORDER — DEXAMETHASONE SODIUM PHOSPHATE 10 MG/ML IJ SOLN
INTRAMUSCULAR | Status: DC | PRN
  Administered 2014-01-12: 4 mg

## 2014-01-12 MED ORDER — LIDOCAINE HCL (CARDIAC) 20 MG/ML IV SOLN
INTRAVENOUS | Status: DC | PRN
  Administered 2014-01-12: 50 mg via INTRAVENOUS

## 2014-01-12 MED ORDER — PROPOFOL 10 MG/ML IV BOLUS
INTRAVENOUS | Status: DC | PRN
  Administered 2014-01-12: 160 mg via INTRAVENOUS

## 2014-01-12 MED ORDER — FENTANYL CITRATE 0.05 MG/ML IJ SOLN
INTRAMUSCULAR | Status: AC
  Administered 2014-01-12: 100 ug
  Filled 2014-01-12: qty 2

## 2014-01-12 MED ORDER — LIDOCAINE HCL (CARDIAC) 20 MG/ML IV SOLN
INTRAVENOUS | Status: AC
  Filled 2014-01-12: qty 5

## 2014-01-12 MED ORDER — MIDAZOLAM HCL 2 MG/2ML IJ SOLN
INTRAMUSCULAR | Status: AC
  Administered 2014-01-12: 2 mg
  Filled 2014-01-12: qty 2

## 2014-01-12 MED ORDER — FENTANYL CITRATE 0.05 MG/ML IJ SOLN
INTRAMUSCULAR | Status: AC
  Filled 2014-01-12: qty 5

## 2014-01-12 SURGICAL SUPPLY — 67 items
BANDAGE ELASTIC 4 VELCRO ST LF (GAUZE/BANDAGES/DRESSINGS) ×2 IMPLANT
BANDAGE ESMARK 6X9 LF (GAUZE/BANDAGES/DRESSINGS) ×1 IMPLANT
BIT DRILL CANN 2.7 (BIT) ×1
BIT DRILL SRG 2.7XCANN AO CPLG (BIT) ×1 IMPLANT
BIT DRL SRG 2.7XCANN AO CPLNG (BIT) ×1
BNDG ESMARK 6X9 LF (GAUZE/BANDAGES/DRESSINGS) ×2
CLSR STERI-STRIP ANTIMIC 1/2X4 (GAUZE/BANDAGES/DRESSINGS) ×2 IMPLANT
COVER SURGICAL LIGHT HANDLE (MISCELLANEOUS) ×2 IMPLANT
CUFF TOURNIQUET SINGLE 34IN LL (TOURNIQUET CUFF) ×2 IMPLANT
DRAPE C-ARM 42X72 X-RAY (DRAPES) IMPLANT
DRAPE C-ARM MINI 42X72 WSTRAPS (DRAPES) ×2 IMPLANT
DRAPE C-ARMOR (DRAPES) ×2 IMPLANT
DRAPE ORTHO SPLIT 77X108 STRL (DRAPES)
DRAPE SURG ORHT 6 SPLT 77X108 (DRAPES) IMPLANT
DRAPE U-SHAPE 47X51 STRL (DRAPES) IMPLANT
DRSG PAD ABDOMINAL 8X10 ST (GAUZE/BANDAGES/DRESSINGS) ×4 IMPLANT
DURAPREP 26ML APPLICATOR (WOUND CARE) ×2 IMPLANT
ELECT REM PT RETURN 9FT ADLT (ELECTROSURGICAL) ×2
ELECTRODE REM PT RTRN 9FT ADLT (ELECTROSURGICAL) ×1 IMPLANT
GLOVE BIO SURGEON STRL SZ7.5 (GLOVE) IMPLANT
GLOVE BIO SURGEON STRL SZ8 (GLOVE) IMPLANT
GLOVE BIOGEL PI IND STRL 8 (GLOVE) ×3 IMPLANT
GLOVE BIOGEL PI INDICATOR 8 (GLOVE) ×3
GLOVE BIOGEL PI ORTHO PRO SZ8 (GLOVE)
GLOVE PI ORTHO PRO STRL SZ8 (GLOVE) IMPLANT
GLOVE SURG SS PI 6.5 STRL IVOR (GLOVE) ×6 IMPLANT
GLOVE SURG SS PI 7.0 STRL IVOR (GLOVE) ×4 IMPLANT
GLOVE SURG SS PI 7.5 STRL IVOR (GLOVE) ×2 IMPLANT
GLOVE SURG SS PI 8.5 STRL IVOR (GLOVE) ×1
GLOVE SURG SS PI 8.5 STRL STRW (GLOVE) ×1 IMPLANT
GOWN STRL REUS W/ TWL LRG LVL3 (GOWN DISPOSABLE) ×1 IMPLANT
GOWN STRL REUS W/ TWL XL LVL3 (GOWN DISPOSABLE) ×1 IMPLANT
GOWN STRL REUS W/TWL 2XL LVL3 (GOWN DISPOSABLE) IMPLANT
GOWN STRL REUS W/TWL LRG LVL3 (GOWN DISPOSABLE) ×1
GOWN STRL REUS W/TWL XL LVL3 (GOWN DISPOSABLE) ×1
K-WIRE ORTHOPEDIC 1.4X150L (WIRE) ×4
KIT BASIN OR (CUSTOM PROCEDURE TRAY) ×2 IMPLANT
KIT ROOM TURNOVER OR (KITS) ×2 IMPLANT
KWIRE ORTHOPEDIC 1.4X150L (WIRE) ×2 IMPLANT
MANIFOLD NEPTUNE II (INSTRUMENTS) ×2 IMPLANT
NEEDLE 22X1 1/2 (OR ONLY) (NEEDLE) ×2 IMPLANT
NS IRRIG 1000ML POUR BTL (IV SOLUTION) ×2 IMPLANT
PACK ORTHO EXTREMITY (CUSTOM PROCEDURE TRAY) ×2 IMPLANT
PAD ABD 8X10 STRL (GAUZE/BANDAGES/DRESSINGS) ×2 IMPLANT
PAD ARMBOARD 7.5X6 YLW CONV (MISCELLANEOUS) ×4 IMPLANT
PAD CAST 4YDX4 CTTN HI CHSV (CAST SUPPLIES) ×2 IMPLANT
PADDING CAST ABS 6INX4YD NS (CAST SUPPLIES) ×1
PADDING CAST ABS COTTON 6X4 NS (CAST SUPPLIES) ×1 IMPLANT
PADDING CAST COTTON 4X4 STRL (CAST SUPPLIES) ×2
SCREW CANN 44X15X4XSLF DRL (Screw) ×2 IMPLANT
SCREW CANNULATED 4.0X44MM (Screw) ×2 IMPLANT
SPONGE GAUZE 4X4 12PLY (GAUZE/BANDAGES/DRESSINGS) ×2 IMPLANT
SPONGE GAUZE 4X4 12PLY STER LF (GAUZE/BANDAGES/DRESSINGS) ×2 IMPLANT
SPONGE LAP 18X18 X RAY DECT (DISPOSABLE) ×2 IMPLANT
STRIP CLOSURE SKIN 1/2X4 (GAUZE/BANDAGES/DRESSINGS) ×2 IMPLANT
SUCTION FRAZIER TIP 10 FR DISP (SUCTIONS) ×2 IMPLANT
SUT MNCRL AB 4-0 PS2 18 (SUTURE) ×4 IMPLANT
SUT MON AB 2-0 CT1 27 (SUTURE) ×2 IMPLANT
SUT VIC AB 0 CT1 27 (SUTURE)
SUT VIC AB 0 CT1 27XBRD ANBCTR (SUTURE) IMPLANT
SYR BULB IRRIGATION 50ML (SYRINGE) ×2 IMPLANT
SYR CONTROL 10ML LL (SYRINGE) IMPLANT
TOWEL OR 17X24 6PK STRL BLUE (TOWEL DISPOSABLE) ×2 IMPLANT
TOWEL OR 17X26 10 PK STRL BLUE (TOWEL DISPOSABLE) ×2 IMPLANT
TUBE CONNECTING 12X1/4 (SUCTIONS) ×2 IMPLANT
WATER STERILE IRR 1000ML POUR (IV SOLUTION) ×2 IMPLANT
YANKAUER SUCT BULB TIP NO VENT (SUCTIONS) IMPLANT

## 2014-01-12 NOTE — Discharge Instructions (Signed)
For your broken leg/ankle No weight on your right leg, leave your splint in place until you see Dr. Eulah PontMurphy in clinic    Foley Catheter Care, Adult A Foley catheter is a soft, flexible tube that is placed into the bladder to drain urine. A Foley catheter may be inserted if:  You leak urine or are not able to control when you urinate (urinary incontinence).  You are not able to urinate when you need to (urinary retention).  You had prostate surgery or surgery on the genitals.  You have certain medical conditions, such as multiple sclerosis, dementia, or a spinal cord injury. If you are going home with a Foley catheter in place, follow the instructions below. TAKING CARE OF THE CATHETER 1. Wash your hands with soap and water. 2. Using mild soap and warm water on a clean washcloth:  Clean the area on your body closest to the catheter insertion site using a circular motion, moving away from the catheter. Never wipe toward the catheter because this could sweep bacteria up into the urethra and cause infection.  Remove all traces of soap. Pat the area dry with a clean towel. For males, reposition the foreskin. 3. Attach the catheter to your leg so there is no tension on the catheter. Use adhesive tape or a leg strap. If you are using adhesive tape, remove any sticky residue left behind by the previous tape you used. 4. Keep the drainage bag below the level of the bladder, but keep it off the floor. 5. Check throughout the day to be sure the catheter is working and urine is draining freely. Make sure the tubing does not become kinked. 6. Do not pull on the catheter or try to remove it. Pulling could damage internal tissues. TAKING CARE OF THE DRAINAGE BAGS You will be given two drainage bags to take home. One is a large overnight drainage bag, and the other is a smaller leg bag that fits underneath clothing. You may wear the overnight bag at any time, but you should never wear the smaller leg bag  at night. Follow the instructions below for how to empty, change, and clean your drainage bags. Emptying the Drainage Bag You must empty your drainage bag when it is    full or at least 2 3 times a day. 1. Wash your hands with soap and water. 2. Keep the drainage bag below your hips, below the level of your bladder. This stops urine from going back into the tubing and into your bladder. 3. Hold the dirty bag over the toilet or a clean container. 4. Open the pour spout at the bottom of the bag and empty the urine into the toilet or container. Do not let the pour spout touch the toilet, container, or any other surface. Doing so can place bacteria on the bag, which can cause an infection. 5. Clean the pour spout with a gauze pad or cotton ball that has rubbing alcohol on it. 6. Close the pour spout. 7. Attach the bag to your leg with adhesive tape or a leg strap. 8. Wash your hands well. Changing the Drainage Bag Change your drainage bag once a month or sooner if it starts to smell bad or look dirty. Below are steps to follow when changing the drainage bag. 1. Wash your hands with soap and water. 2. Pinch off the rubber catheter so that urine does not spill out. 3. Disconnect the catheter tube from the drainage tube at the connection valve.  Do not let the tubes touch any surface. 4. Clean the end of the catheter tube with an alcohol wipe. Use a different alcohol wipe to clean the end of the drainage tube. 5. Connect the catheter tube to the drainage tube of the clean drainage bag. 6. Attach the new bag to the leg with adhesive tape or a leg strap. Avoid attaching the new bag too tightly. 7. Wash your hands well. Cleaning the Drainage Bag 1. Wash your hands with soap and water. 2. Wash the bag in warm, soapy water. 3. Rinse the bag thoroughly with warm water. 4. Fill the bag with a solution of white vinegar and water (1 cup vinegar to 1 qt warm water [.2 L vinegar to 1 L warm water]). Close the  bag and soak it for 30 minutes in the solution. 5. Rinse the bag with warm water. 6. Hang the bag to dry with the pour spout open and hanging downward. 7. Store the clean bag (once it is dry) in a clean plastic bag. 8. Wash your hands well. PREVENTING INFECTION  Wash your hands before and after handling your catheter.  Take showers daily and wash the area where the catheter enters your body. Do not take baths. Replace wet leg straps with dry ones, if this applies.  Do not use powders, sprays, or lotions on the genital area. Only use creams, lotions, or ointments as directed by your caregiver.  For females, wipe from front to back after each bowel movement.  Drink enough fluids to keep your urine clear or pale yellow unless you have a fluid restriction.  Do not let the drainage bag or tubing touch or lie on the floor.  Wear cotton underwear to absorb moisture and to keep your skin drier. SEEK MEDICAL CARE IF:   Your urine is cloudy or smells unusually bad.  Your catheter becomes clogged.  You are not draining urine into the bag or your bladder feels full.  Your catheter starts to leak. SEEK IMMEDIATE MEDICAL CARE IF:   You have pain, swelling, redness, or pus where the catheter enters the body.  You have pain in the abdomen, legs, lower back, or bladder.  You have a fever.  You see blood fill the catheter, or your urine is pink or red.  You have nausea, vomiting, or chills.  Your catheter gets pulled out. MAKE SURE YOU:   Understand these instructions.  Will watch your condition.  Will get help right away if you are not doing well or get worse. Document Released: 09/10/2005 Document Revised: 01/05/2013 Document Reviewed: 09/01/2012 [facility] Patient Information 2014 [place], [place].

## 2014-01-12 NOTE — Progress Notes (Signed)
UR completed. Pt likely for d/c home tomorrow. Will require DME and HH.  Will speak with pt in the morning to discuss agency choice and DME.  Carlyle LipaMichelle Maiyah Goyne, RN BSN MHA CCM Trauma/Neuro ICU Case Manager [phone]
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
## 2014-01-12 NOTE — Progress Notes (Signed)
Raynelle FanningJulie, CRNA at bedside.
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
## 2014-01-12 NOTE — Progress Notes (Signed)
Report given to Shirley RN
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
## 2014-01-12 NOTE — Progress Notes (Signed)
CSW attempted twice today to visit with patient to complete SBIRT but she was off the unit- underwent an ORIF of her ankle.  Patient referred for SNF placement but PT recommends home health. RNCM is aware and will provide services as needed at d/c for return home  Lupita LeashDonna T. West PughCrowder, LCSWA  [phone]

## 2014-01-12 NOTE — Transfer of Care (Signed)
Immediate Anesthesia Transfer of Care Note  Patient: Judith PriestMeleisha [name]  Procedure(s) Performed: Procedure(s): OPEN REDUCTION INTERNAL FIXATION (ORIF) ANKLE FRACTURE (Right)  Patient Location: PACU  Anesthesia Type:General  Level of Consciousness: awake and oriented  Airway & Oxygen Therapy: Patient Spontanous Breathing  Post-op Assessment: Report given to PACU RN  Post vital signs: Reviewed and stable  Complications: No apparent anesthesia complications

## 2014-01-12 NOTE — OR Nursing (Signed)
Patient questions Latex allergy. Latex precautions observed intra-op on 01-12-2014, however Latex foley cath was present upon arrival to OR , inserted by Dr. Lennox LaityEstridge

## 2014-01-12 NOTE — Anesthesia Postprocedure Evaluation (Signed)
Anesthesia Post Note  Patient: Judith PriestMeleisha Molinelli  Procedure(s) Performed: Procedure(s) (LRB): OPEN REDUCTION INTERNAL FIXATION (ORIF) ANKLE FRACTURE (Right)  Anesthesia type: general  Patient location: PACU  Post pain: Pain level controlled  Post assessment: Patient's Cardiovascular Status Stable  Last Vitals:  Filed Vitals:   01/12/14 1210  BP:   Pulse:   Temp:   Resp: 21    Post vital signs: Reviewed and stable  Level of consciousness: sedated  Complications: No apparent anesthesia complications

## 2014-01-12 NOTE — Interval H&P Note (Signed)
History and Physical Interval Note:  01/12/2014 7:22 AM  Judith PriestMeleisha Bosque  has presented today for surgery, with the diagnosis of Right ankle fracture  The various methods of treatment have been discussed with the patient and family. After consideration of risks, benefits and other options for treatment, the patient has consented to  Procedure(s): OPEN REDUCTION INTERNAL FIXATION (ORIF) ANKLE FRACTURE (Right) as a surgical intervention .  The patient's history has been reviewed, patient examined, no change in status, stable for surgery.  I have reviewed the patient's chart and labs.  Questions were answered to the patient's satisfaction.     Sheral Apleyimothy D Jensine Luz

## 2014-01-12 NOTE — Anesthesia Procedure Notes (Addendum)
Anesthesia Regional Block:  Popliteal block  Pre-Anesthetic Checklist: ,, timeout performed, Correct Patient, Correct Site, Correct Laterality, Correct Procedure, Correct Position, site marked, Risks and benefits discussed,  Surgical consent,  Pre-op evaluation,  At surgeon's request and post-op pain management  Laterality: Right  Prep: chloraprep       Needles:   Needle Type: Echogenic Stimulator Needle     Needle Length: 9cm 9 cm Needle Gauge: 22 and 22 G    Additional Needles:  Procedures: nerve stimulator Popliteal block  Nerve Stimulator or Paresthesia:  Response: 0.5 mA,   Additional Responses:   Narrative:  Start time: 01/12/2014 10:21 AM End time: 01/12/2014 10:29 AM Injection made incrementally with aspirations every 5 mL. Anesthesiologist: Dr Gypsy BalsamKasik  Additional Notes: 1021-1029 R Popliteal Nerve Block POP CHG prep, sterile tech #22 stim/echo needle with stim down to .5ma Multiple neg asp Vernia BuffMarc .5% w/epi 1:200000 total 40cc+decadron 4mg  infil No compl Dr Gypsy BalsamKasik   Procedure Name: LMA Insertion Date/Time: 01/12/2014 10:41 AM Performed by: De NurseENNIE, Miklo Aken E Pre-anesthesia Checklist: Patient identified, Emergency Drugs available, Suction available, Patient being monitored and Timeout performed Patient Re-evaluated:Patient Re-evaluated prior to inductionOxygen Delivery Method: Circle system utilized Preoxygenation: Pre-oxygenation with 100% oxygen Intubation Type: IV induction LMA: LMA inserted LMA Size: 4.0 Number of attempts: 1 Placement Confirmation: positive ETCO2 and breath sounds checked- equal and bilateral Tube secured with: Tape Dental Injury: Teeth and Oropharynx as per pre-operative assessment

## 2014-01-12 NOTE — Progress Notes (Signed)
I have seen and examined the pt and agree with NP-Riebock's progress note. Pt in OR

## 2014-01-13 MED ORDER — NITROFURANTOIN MONOHYD MACRO 100 MG PO CAPS: 100.0000 mg | ORAL_CAPSULE | Freq: Every day | ORAL | Status: AC

## 2014-01-13 MED ORDER — TRAMADOL HCL 50 MG PO TABS: 50.0000 mg | ORAL_TABLET | Freq: Four times a day (QID) | ORAL | Status: AC | PRN
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
## 2014-01-13 NOTE — Progress Notes (Signed)
Patient discharged to home accompanied by mother. Discharge instructions and rx given and explained and patient/family stated understanding. IV was removed. Staples were removed from abdomen and steri-strips applied. Patient left unit in a stable condition with all personal belongings via wheelchair.
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
## 2014-01-13 NOTE — Progress Notes (Signed)
Should be able to go home today without a problem.  Keep Foley until Urology says it is okay to discontinue.  This patient has been seen and I agree with the findings and treatment plan.  Marta LamasJames O. Gae BonWyatt, III, MD, FACS [phone] (pager) [phone] (direct pager) Trauma Surgeon

## 2014-01-13 NOTE — Clinical Social Work Note (Signed)
Clinical Social Worker continuing to follow patient and family for support.  CSW spoke to patient and patient mother at bedside who state that patient plans to return home with her mother who can provide 24 hour support at home.  Patient expressed her excitement for return home and feels that she has adequate resources for discharge.  SBIRT complete.  Clinical Social Worker will sign off for now as social work intervention is no longer needed. Please consult us again if new need arises.  Judith [name], [place] [phone]
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
## 2014-01-13 NOTE — Progress Notes (Signed)
Spoke with patient and mom about DME and HH Needs for discharge today. Mom reports that they have access to the necessary DME so will not need that fulfilled here.  Discussed orders for PT/OT. Mom said that she doesn't feel the OT is necessary and would prefer for one thorough PT eval in the home with instruction on what the patient can do on her own because she is concerned about the financial obligations for copays.  Address and phone in EPIC are confirmed as correct. Reviewed the foley catheter and importance of maintaining a sterile environment and monitoring for signs and symptoms of infection.  Also reviewed use of pain medicine. Mom stated understanding of all instructions. Updated assigned unit RN that my portion of discharge was completed.

## 2014-01-14 ENCOUNTER — Encounter (HOSPITAL_COMMUNITY): Payer: Self-pay | Admitting: Orthopedic Surgery

## 2014-01-20 ENCOUNTER — Encounter (INDEPENDENT_AMBULATORY_CARE_PROVIDER_SITE_OTHER): Payer: Self-pay

## 2014-01-20 ENCOUNTER — Ambulatory Visit (INDEPENDENT_AMBULATORY_CARE_PROVIDER_SITE_OTHER): Payer: 59 | Admitting: Orthopedic Surgery

## 2014-01-20 ENCOUNTER — Telehealth (HOSPITAL_COMMUNITY): Payer: Self-pay

## 2014-01-20 VITALS — BP 130/76 | HR 74 | Temp 98.5°F | Resp 16 | Ht 67.0 in | Wt 139.0 lb

## 2014-01-20 DIAGNOSIS — T8149XA Infection following a procedure, other surgical site, initial encounter: Secondary | ICD-10-CM

## 2014-01-20 DIAGNOSIS — T8140XA Infection following a procedure, unspecified, initial encounter: Secondary | ICD-10-CM

## 2014-01-20 MED ORDER — DOXYCYCLINE HYCLATE 50 MG PO CAPS: 100.0000 mg | ORAL_CAPSULE | Freq: Two times a day (BID) | ORAL | Status: AC

## 2014-01-21 NOTE — Telephone Encounter (Signed)
Spoke with mom who said patient has had some bleeding and other drainage from bottom of wound. Asked her to bring patient into clinic that afternoon.
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
## 2014-01-25 ENCOUNTER — Telehealth (HOSPITAL_COMMUNITY): Payer: Self-pay

## 2014-01-26 NOTE — Telephone Encounter (Signed)
No answer, could not leave message. Will see pt tomorrow in clinic.

## 2014-01-27 ENCOUNTER — Ambulatory Visit (INDEPENDENT_AMBULATORY_CARE_PROVIDER_SITE_OTHER): Payer: 59 | Admitting: General Surgery

## 2014-01-27 ENCOUNTER — Other Ambulatory Visit (INDEPENDENT_AMBULATORY_CARE_PROVIDER_SITE_OTHER): Payer: Self-pay | Admitting: General Surgery

## 2014-01-27 ENCOUNTER — Encounter (INDEPENDENT_AMBULATORY_CARE_PROVIDER_SITE_OTHER): Payer: Self-pay

## 2014-01-27 VITALS — BP 122/74 | HR 102 | Temp 98.0°F | Resp 14 | Ht 67.0 in

## 2014-01-27 DIAGNOSIS — R300 Dysuria: Secondary | ICD-10-CM

## 2014-01-27 DIAGNOSIS — Z9889 Other specified postprocedural states: Secondary | ICD-10-CM

## 2014-01-27 NOTE — Progress Notes (Signed)
Subjective: wound check, dysuria     Patient ID: Judith PriestMeleisha [name], female   DOB: 07/08/1996, 18 y.o.   MRN: [id number]  HPI Judith RuddyMeleisha comes in s/p ex lap w/repair of bladder rupture.  She has not had any additional problems with her wound.  Appetite is good.  Normal BMs.  No discharge.  Denies fever or chills.  Her only concern is burning with urination.  Denies hematuria.  She has not followed up with Dr. Mena GoesEskridge.  Review of Systems General: no fever, chills, sweats. Abd: no n/v.  Melena or hematochezia. GU: no hematuria, frequency.     Objective:   Physical Exam  Constitutional: She appears well-developed and well-nourished.  Abdominal: Soft. Bowel sounds are normal. She exhibits no distension. There is no tenderness. There is no rebound and no guarding.  Healed midline abdominal wound   Filed Vitals:   01/27/14 1513  BP: 122/74  Pulse: 102  Temp: 98 F (36.7 C)  Resp: 14       Assessment:     S/p ex lap with repair of bladder rupture dysuria    Plan:     She is doing great and her wound has healed nicely.   Urinalysis to rule out a UTI.  I asked her to touch base with Dr. Estil DaftEskridge's office May return to normal activities. Follow up in clinic as needed.  Beau Ramsburg, ANP-BC

## 2014-01-27 NOTE — Patient Instructions (Signed)
You may resume normal activities.  Follow up with Dr. Martha ClanEskridge(urologist)---[phone]

## 2014-01-28 ENCOUNTER — Other Ambulatory Visit (INDEPENDENT_AMBULATORY_CARE_PROVIDER_SITE_OTHER): Payer: Self-pay | Admitting: *Deleted

## 2014-01-28 ENCOUNTER — Telehealth (INDEPENDENT_AMBULATORY_CARE_PROVIDER_SITE_OTHER): Payer: Self-pay

## 2014-01-28 ENCOUNTER — Telehealth (INDEPENDENT_AMBULATORY_CARE_PROVIDER_SITE_OTHER): Payer: Self-pay | Admitting: *Deleted

## 2014-01-28 DIAGNOSIS — N390 Urinary tract infection, site not specified: Secondary | ICD-10-CM

## 2014-01-28 LAB — URINALYSIS
Bilirubin Urine: NEGATIVE
GLUCOSE, UA: NEGATIVE mg/dL
Hgb urine dipstick: NEGATIVE
Nitrite: POSITIVE — AB
Protein, ur: 30 mg/dL — AB
Specific Gravity, Urine: 1.028 (ref 1.005–1.030)
Urobilinogen, UA: 0.2 mg/dL (ref 0.0–1.0)
pH: 5.5 (ref 5.0–8.0)

## 2014-01-28 MED ORDER — CIPROFLOXACIN HCL 250 MG PO TABS: 250.0000 mg | ORAL_TABLET | Freq: Two times a day (BID) | ORAL | Status: AC

## 2014-01-28 NOTE — Telephone Encounter (Signed)
Called Pt, per [place] Valley[place]mina, spoke to mom, CourtlandMichelle.  Relayed the message below and confirmed pt's pharmacy is [facility].  Sent rx into pharmacy electronically.  Mom understood and is in agreeance at this time.  Victorino DikeJennifer

## 2014-01-28 NOTE — Telephone Encounter (Signed)
Message copied by Elliot CousinWITTY, Ellard Nan on Thu Jan 28, 2014  3:46 PM ------      Message from: Ashok NorrisIEBOCK, EMINA      Created: Thu Jan 28, 2014 11:34 AM       Please call pt and let her know she has a uti and call in Rx for cipro 250mg  PO BID x3 days, no refills.            thanks ------

## 2014-01-28 NOTE — Telephone Encounter (Signed)
Message copied by Brennan BaileyBROOKS, Ian Cavey on Thu Jan 28, 2014  3:30 PM ------      Message from: Ashok NorrisIEBOCK, EMINA      Created: Thu Jan 28, 2014  3:19 PM       Ok to add urine culture.      thanks      ----- Message -----         From: Brennan BaileyMichelle [place], CMA         Sent: 01/28/2014   1:35 PM           To: Ashok NorrisEmina Riebock, NP            Anette RiedelEmina,      You sent this pt for a UA yesterday. [facility] is wanting a culture on the UA sample that was taken. [facility] from [facility] office since we ordered the original UA. Are you ok with adding order for culture so pt doesn't have to provide a 2nd UA sample?             Thanks      Marcelino DusterMichelle       ------

## 2014-01-28 NOTE — Telephone Encounter (Signed)
Called [facility] and added a urine culture verbal order.

## 2014-01-29 LAB — URINE CULTURE
COLONY COUNT: NO GROWTH
Organism ID, Bacteria: NO GROWTH

## 2014-02-26 ENCOUNTER — Encounter (INDEPENDENT_AMBULATORY_CARE_PROVIDER_SITE_OTHER): Payer: Self-pay
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
